# Patient Record
Sex: Female | Born: 1955 | Race: White | Hispanic: No | Marital: Married | Smoking: Never smoker
Health system: Southern US, Community
[De-identification: ages and names within clinical notes are randomized; demographics above are authoritative.]

---

## 1976-01-30 HISTORY — PX: PITUITARY SURGERY: SHX203

## 2012-02-14 ENCOUNTER — Encounter (HOSPITAL_COMMUNITY): Payer: Self-pay | Admitting: Radiology

## 2012-02-14 ENCOUNTER — Inpatient Hospital Stay (HOSPITAL_COMMUNITY)
Admission: AD | Admit: 2012-02-14 | Discharge: 2012-02-15 | DRG: 312 | Disposition: A | Discharge status: Home / Self Care | Payer: 59 | Source: Ambulatory Visit | Attending: Internal Medicine | Admitting: Internal Medicine

## 2012-02-14 ENCOUNTER — Observation Stay (HOSPITAL_COMMUNITY): Payer: 59

## 2012-02-14 ENCOUNTER — Inpatient Hospital Stay (HOSPITAL_COMMUNITY): Payer: 59

## 2012-02-14 DIAGNOSIS — R55 Syncope and collapse: Secondary | ICD-10-CM | POA: Diagnosis present

## 2012-02-14 DIAGNOSIS — R630 Anorexia: Secondary | ICD-10-CM | POA: Diagnosis present

## 2012-02-14 DIAGNOSIS — E876 Hypokalemia: Secondary | ICD-10-CM | POA: Diagnosis present

## 2012-02-14 DIAGNOSIS — E43 Unspecified severe protein-calorie malnutrition: Secondary | ICD-10-CM | POA: Diagnosis present

## 2012-02-14 DIAGNOSIS — Z885 Allergy status to narcotic agent status: Secondary | ICD-10-CM

## 2012-02-14 DIAGNOSIS — E860 Dehydration: Secondary | ICD-10-CM | POA: Diagnosis present

## 2012-02-14 DIAGNOSIS — E559 Vitamin D deficiency, unspecified: Secondary | ICD-10-CM | POA: Diagnosis present

## 2012-02-14 DIAGNOSIS — R634 Abnormal weight loss: Secondary | ICD-10-CM | POA: Diagnosis present

## 2012-02-14 DIAGNOSIS — Z681 Body mass index (BMI) 19 or less, adult: Secondary | ICD-10-CM

## 2012-02-14 DIAGNOSIS — M549 Dorsalgia, unspecified: Secondary | ICD-10-CM | POA: Diagnosis present

## 2012-02-14 DIAGNOSIS — Z880 Allergy status to penicillin: Secondary | ICD-10-CM

## 2012-02-14 DIAGNOSIS — I951 Orthostatic hypotension: Principal | ICD-10-CM | POA: Diagnosis present

## 2012-02-14 LAB — IRON AND TIBC
Saturation Ratios: 10 % — ABNORMAL LOW (ref 20–55)
UIBC: 390 ug/dL (ref 125–400)

## 2012-02-14 LAB — CBC
HCT: 32.1 % — ABNORMAL LOW (ref 36.0–46.0)
Hemoglobin: 10.1 g/dL — ABNORMAL LOW (ref 12.0–15.0)
RBC: 4.22 MIL/uL (ref 3.87–5.11)

## 2012-02-14 LAB — FERRITIN: Ferritin: 5 ng/mL — ABNORMAL LOW (ref 10–291)

## 2012-02-14 LAB — COMPREHENSIVE METABOLIC PANEL
ALT: 20 U/L (ref 0–35)
Alkaline Phosphatase: 53 U/L (ref 39–117)
CO2: 37 mEq/L — ABNORMAL HIGH (ref 19–32)
Chloride: 84 mEq/L — ABNORMAL LOW (ref 96–112)
GFR calc Af Amer: 69 mL/min — ABNORMAL LOW (ref >90)
GFR calc non Af Amer: 59 mL/min — ABNORMAL LOW (ref >90)
Glucose, Bld: 90 mg/dL (ref 70–99)
Potassium: 2.2 mEq/L — CL (ref 3.5–5.1)
Sodium: 134 mEq/L — ABNORMAL LOW (ref 135–145)
Total Bilirubin: 0.6 mg/dL (ref 0.3–1.2)

## 2012-02-14 LAB — CK TOTAL AND CKMB (NOT AT ARMC)
CK, MB: 4.5 ng/mL — ABNORMAL HIGH (ref 0.3–4.0)
Relative Index: 1.1 (ref 0.0–2.5)
Total CK: 462 U/L — ABNORMAL HIGH (ref 7–177)
Total CK: 395 U/L — ABNORMAL HIGH (ref 7–177)

## 2012-02-14 LAB — RETICULOCYTES
RBC.: 3.92 MIL/uL (ref 3.87–5.11)
Retic Ct Pct: 0.9 % (ref 0.4–3.1)

## 2012-02-14 LAB — CORTISOL: Cortisol, Plasma: 19.3 ug/dL

## 2012-02-14 LAB — VITAMIN B12: Vitamin B-12: 385 pg/mL (ref 211–911)

## 2012-02-14 MED ORDER — ALUM & MAG HYDROXIDE-SIMETH 200-200-20 MG/5ML PO SUSP: 30.0000 mL | Freq: Four times a day (QID) | ORAL | Status: DC | PRN

## 2012-02-14 MED ORDER — SODIUM CHLORIDE 0.9 % IV SOLN
INTRAVENOUS | Status: DC
  Administered 2012-02-14: 13:00:00 via INTRAVENOUS
  Administered 2012-02-15: 100 mL/h via INTRAVENOUS

## 2012-02-14 MED ORDER — ACETAMINOPHEN 325 MG PO TABS: 650.0000 mg | ORAL_TABLET | Freq: Four times a day (QID) | ORAL | Status: DC | PRN

## 2012-02-14 MED ORDER — ONDANSETRON HCL 4 MG/2ML IJ SOLN: 4.0000 mg | Freq: Four times a day (QID) | INTRAMUSCULAR | Status: DC | PRN

## 2012-02-14 MED ORDER — SODIUM CHLORIDE 0.9 % IJ SOLN
3.0000 mL | Freq: Two times a day (BID) | INTRAMUSCULAR | Status: DC
  Administered 2012-02-15: 3 mL via INTRAVENOUS

## 2012-02-14 MED ORDER — IOHEXOL 300 MG/ML  SOLN
25.0000 mL | INTRAMUSCULAR | Status: AC
  Administered 2012-02-14 (×2): 25 mL via ORAL

## 2012-02-14 MED ORDER — HYDROMORPHONE HCL PF 1 MG/ML IJ SOLN: 0.5000 mg | INTRAMUSCULAR | Status: DC | PRN

## 2012-02-14 MED ORDER — IOHEXOL 300 MG/ML  SOLN
80.0000 mL | Freq: Once | INTRAMUSCULAR | Status: AC | PRN
  Administered 2012-02-14: 80 mL via INTRAVENOUS

## 2012-02-14 MED ORDER — POTASSIUM CHLORIDE 10 MEQ/100ML IV SOLN
10.0000 meq | INTRAVENOUS | Status: AC
  Administered 2012-02-14 (×4): 10 meq via INTRAVENOUS
  Filled 2012-02-14 (×4): qty 100

## 2012-02-14 MED ORDER — POTASSIUM CHLORIDE CRYS ER 20 MEQ PO TBCR
40.0000 meq | EXTENDED_RELEASE_TABLET | Freq: Two times a day (BID) | ORAL | Status: DC
  Administered 2012-02-14 – 2012-02-15 (×2): 40 meq via ORAL
  Filled 2012-02-14 (×3): qty 2

## 2012-02-14 MED ORDER — ENSURE COMPLETE PO LIQD
237.0000 mL | Freq: Two times a day (BID) | ORAL | Status: DC
  Administered 2012-02-15: 237 mL via ORAL

## 2012-02-14 MED ORDER — ACETAMINOPHEN 650 MG RE SUPP: 650.0000 mg | Freq: Four times a day (QID) | RECTAL | Status: DC | PRN

## 2012-02-14 MED ORDER — ONDANSETRON HCL 4 MG PO TABS: 4.0000 mg | ORAL_TABLET | Freq: Four times a day (QID) | ORAL | Status: DC | PRN

## 2012-02-14 NOTE — Progress Notes (Signed)
K+ of 2.2 reported to Dr Isidoro Donning via text page.

## 2012-02-14 NOTE — Progress Notes (Signed)
*  PRELIMINARY RESULTS* Vascular Ultrasound Carotid Duplex (Doppler) has been completed.  Preliminary findings: Bilateral:  No evidence of hemodynamically significant internal carotid artery stenosis.   Vertebral artery flow is antegrade.      Farrel Demark, RDMS, RVT 02/14/2012, 3:11 PM

## 2012-02-14 NOTE — H&P (Signed)
History and Physical       Hospital Admission Note Date: 02/14/2012  Patient name: Bethany Frost Medical record number: [id number] Date of birth: 12/17/1955 Age: 57 y.o. 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 Fredirick Maudlin office today for dizziness and syncopal episode. History was obtained from the patient and her husband. Patient has not seen a physician in over 25 years, has never had a colonoscopy or EGD or any mammogram or any preventive medicine done. Per patient, she woke up early yesterday morning and while she was fixing the coffee she became dizzy and lightheaded, passed out. She began her consciousness and was cleaning the mess in the kitchen when she again passed out. Her husband was at her side and called 911, while in route to ED patient regained consciousness and did not want to be hospitalized. She endorses having off-and-on dizziness for a month, she also states that she has passed out a few times. Per patient she is a very active person and had been running and hiking uptill a month ago. Now it has been somewhat difficult for her to walk a block. She also states that she does not have appetite and she's not a 'big eater'. She denied any chest pain, nausea, vomiting, abdominal pain, diarrhea or any bleeding in the stools. Patient states that she does not have a scale in the house but thinks she has lost about 5-10 pounds in last month. Patient's husband separately told me that she deliberately does not eat and drinks only and she has been doing this for last 20 years, he felt that she has anorexia. He and the children has been very concerned about her.  He also stated that she runs for hours 2-3 times a day uptill a month ago.   Review of Systems:  Constitutional: Please see history of present illness  HEENT: Denies photophobia, eye pain, redness, hearing loss, ear pain, congestion, sore throat, rhinorrhea, sneezing, mouth sores, trouble swallowing, neck pain, neck stiffness and tinnitus.   Respiratory: Denies SOB, DOE, cough, chest tightness,  and wheezing.   Cardiovascular: Denies chest pain, palpitations and leg swelling.  Gastrointestinal: Denies nausea, vomiting, abdominal pain, diarrhea, constipation, blood in stool and abdominal distention.  patient endorses having loss of appetite Genitourinary: Denies dysuria, urgency, frequency, hematuria, flank pain and difficulty urinating.  Musculoskeletal: She endorses having low back pain due to fall yesterday  Skin: Denies pallor, rash and wound.  Neurological: Please see HPI Hematological: Denies adenopathy. Easy bruising, personal or family bleeding history  Psychiatric/Behavioral: Denies suicidal ideation, mood changes, confusion, nervousness, sleep disturbance and agitation  Past Medical History: No past medical history on file.  Past surgical history: History of pituitary surgery 1978 for pituitary tumor removal   Medications: Patient is not on any medications prior to admission Prior to Admission medications   Not on File    Allergies:   Allergies  Allergen Reactions  . Codeine Hives and Rash  . Penicillins Hives and Rash    Social History:  does not have a smoking history on file. She does not have any smokeless tobacco history on file. Her alcohol and drug histories not on file. She lives at home with her family, married,  3 children   Family History: No family history on file.  Physical Exam: Vitals at Dr. Darene Lamer office today weight 72.6 pounds, BMI 13.49 blood pressure lying 110/80 pulse 64 on standing 90/70 pulse 72 General: Alert, awake, oriented x3, in no  acute distress. Cachectic appearing HEENT: normocephalic, atraumatic, anicteric sclera, pale conjunctiva, PERLA, oropharynx clear Neck: supple, no masses or lymphadenopathy, no goiter, no bruits  Heart: Regular rate and rhythm, without murmurs, rubs or gallops. Lungs: Clear to auscultation bilaterally, no wheezing, rales or rhonchi. Abdomen: Soft, nontender, nondistended, NBS, no masses, ?Hepatomegaly, scaphoid  . Extremities: No clubbing, cyanosis or edema with positive pedal pulses. Neuro: Grossly intact, no focal neurological deficits, strength 5/5 upper and lower extremities bilaterally Psych: alert and oriented x 3, normal mood and affect Skin: no rashes or lesions, warm and dry   LABS on Admission:  None of the labs are available yet  Radiological Exams on Admission: No results found.  Assessment/Plan Principal Problem:  *Syncope and collapse: Likely due to severe protein-calorie malnutrition, severe dehydration, orthostatic hypotension - admit to tele, serial cardiac enzymes, IVfluid hydration, 2D ECHO, carotid Doppler for further workup  Active Problems:  Dehydration: Continue IV fluid hydration   Severe protein-calorie malnutrition/ weight anorexia, BMI of 13.4  - Although occult malignancy is a concern as patient has not seen a physician in 25 years, had no colonoscopy or mammogram or any preventive medicine, but she appears to have significant anorexia (as endorsed by husband and family). I have ordered CT abd and pelvis to rule out any occult malignancy  - ordered CBC, CMET, Albumin, TSH, vit B12, folate, cortisol level for work-up - baseline CXR and EKG - I explained to the patient that she will need to follow-up with her PCP for C-scope and mammogram out-patient.  Back Pain: - Lumbar Xray for further work-up  DVT prophylaxis: SCD's  CODE STATUS: Full Code  Further plan will depend as patient's clinical course evolves and further radiologic and laboratory data  become available.   Time Spent on Admission: 1 hour  Fuller Makin M.D. [facility] 02/14/2012, 1:46 PM Pager: [phone]  If 7PM-7AM, please contact night-coverage [email or web address] Password [email or web address]

## 2012-02-14 NOTE — Progress Notes (Signed)
  Echocardiogram 2D Echocardiogram has been performed.  Bethany Frost A 02/14/2012, 3:00 PM

## 2012-02-15 DIAGNOSIS — R630 Anorexia: Secondary | ICD-10-CM

## 2012-02-15 DIAGNOSIS — E876 Hypokalemia: Secondary | ICD-10-CM

## 2012-02-15 DIAGNOSIS — E860 Dehydration: Secondary | ICD-10-CM

## 2012-02-15 DIAGNOSIS — E43 Unspecified severe protein-calorie malnutrition: Secondary | ICD-10-CM

## 2012-02-15 DIAGNOSIS — I951 Orthostatic hypotension: Principal | ICD-10-CM

## 2012-02-15 DIAGNOSIS — R55 Syncope and collapse: Secondary | ICD-10-CM

## 2012-02-15 LAB — CK TOTAL AND CKMB (NOT AT ARMC)
CK, MB: 4.6 ng/mL — ABNORMAL HIGH (ref 0.3–4.0)
Total CK: 322 U/L — ABNORMAL HIGH (ref 7–177)

## 2012-02-15 LAB — CBC
MCHC: 31.6 g/dL (ref 30.0–36.0)
RDW: 18.2 % — ABNORMAL HIGH (ref 11.5–15.5)

## 2012-02-15 LAB — BASIC METABOLIC PANEL
BUN: 10 mg/dL (ref 6–23)
Calcium: 8.8 mg/dL (ref 8.4–10.5)
Creatinine, Ser: 0.93 mg/dL (ref 0.50–1.10)
GFR calc Af Amer: 78 mL/min — ABNORMAL LOW (ref >90)
GFR calc non Af Amer: 67 mL/min — ABNORMAL LOW (ref >90)

## 2012-02-15 LAB — LIPID PANEL
Cholesterol: 216 mg/dL — ABNORMAL HIGH (ref 0–200)
Triglycerides: 59 mg/dL (ref <150)

## 2012-02-15 LAB — VITAMIN D 25 HYDROXY (VIT D DEFICIENCY, FRACTURES): Vit D, 25-Hydroxy: 21 ng/mL — ABNORMAL LOW (ref 30–89)

## 2012-02-15 MED ORDER — ENSURE COMPLETE PO LIQD: 237.0000 mL | Freq: Two times a day (BID) | ORAL | Status: AC

## 2012-02-15 MED ORDER — VITAMIN D3 1.25 MG (50000 UT) PO CAPS: 1.0000 | ORAL_CAPSULE | ORAL | Status: DC

## 2012-02-15 MED ORDER — VITAMIN D3 1.25 MG (50000 UT) PO CAPS: 1.0000 | ORAL_CAPSULE | ORAL | Status: AC

## 2012-02-15 MED ORDER — THERA VITAL M PO TABS: 1.0000 | ORAL_TABLET | Freq: Every day | ORAL | Status: AC

## 2012-02-15 NOTE — Discharge Instructions (Signed)
Syncope Syncope means a person passes out (faints). The person usually wakes up in less than 5 minutes. It is important to seek medical care for syncope. HOME CARE  Have someone stay with you until you feel normal.  Do not drive, use machines, or play sports until your doctor says it is okay.  Keep all doctor visits as told.  Lie down when you feel like you might pass out. Take deep breaths. Wait until you feel normal before standing up.  Drink enough fluids to keep your pee (urine) clear or pale yellow.  If you take blood pressure or heart medicine, get up slowly. Take several minutes to sit and then stand. GET HELP RIGHT AWAY IF:   You have a severe headache.  You have pain in the chest, belly (abdomen), or back.  You are bleeding from the mouth or butt (rectum).  You have black or tarry poop (stool).  You have an irregular or very fast heartbeat.  You have pain with breathing.  You keep passing out, or you have shaking (seizures) when you pass out.  You pass out when sitting or lying down.  You feel confused.  You have trouble walking.  You have severe weakness.  You have vision problems. If you fainted, call your local emergency services (911 in [place] Do not drive yourself to the hospital. MAKE SURE YOU:   Understand these instructions.  Will watch your condition.  Will get help right away if you are not doing well or get worse. Document Released: 07/04/2007 Document Revised: 07/17/2011 Document Reviewed: 03/16/2011 [facility] Patient Information 2013 [place], [place]. Syncope Syncope means a person passes out (faints). The person usually wakes up in less than 5 minutes. It is important to seek medical care for syncope. HOME CARE  Have someone stay with you until you feel normal.  Do not drive, use machines, or play sports until your doctor says it is okay.  Keep all doctor visits as told.  Lie down when you feel like you might pass out. Take deep  breaths. Wait until you feel normal before standing up.  Drink enough fluids to keep your pee (urine) clear or pale yellow.  If you take blood pressure or heart medicine, get up slowly. Take several minutes to sit and then stand. GET HELP RIGHT AWAY IF:   You have a severe headache.  You have pain in the chest, belly (abdomen), or back.  You are bleeding from the mouth or butt (rectum).  You have black or tarry poop (stool).  You have an irregular or very fast heartbeat.  You have pain with breathing.  You keep passing out, or you have shaking (seizures) when you pass out.  You pass out when sitting or lying down.  You feel confused.  You have trouble walking.  You have severe weakness.  You have vision problems. If you fainted, call your local emergency services (911 in [place] Do not drive yourself to the hospital. MAKE SURE YOU:   Understand these instructions.  Will watch your condition.  Will get help right away if you are not doing well or get worse. Document Released: 07/04/2007 Document Revised: 07/17/2011 Document Reviewed: 03/16/2011 [facility] Patient Information 2013 [place], [place].

## 2012-02-15 NOTE — Discharge Summary (Signed)
Physician Discharge Summary  Bethany Frost YNW:[id number] DOB: 01-Jun-1955 DOA: 02/14/2012  PCP: Mickie Hillier, MD  Admit date: 02/14/2012 Discharge date: 02/15/2012  Time spent: >30 minutes  Recommendations for Outpatient Follow-up:  1. Further evaluation, assistance and help treating her anorexia condition 2. Recheck Vit D levels in about 12 weeks and change high dose vit D to maintenance dose depending levels. 3. BMET, Magnesium and Phosphorus level to be check during follow up visit to follow electrolytes.  Discharge Diagnoses:  Principal Problem:  *Syncope and collapse Active Problems:  Dehydration  Severe protein-calorie malnutrition  Anorexia  Orthostatic hypotension  Back pain Vit d deficiency  Discharge Condition: Stable and improved. Will arrange follow up with PCP and has been encourage to improve po intake and hydration.   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 Fredirick Maudlin office today for dizziness and syncopal episode. History was obtained from the patient and her husband. Patient has not seen a physician in over 25 years, has never had a colonoscopy or EGD or any mammogram or any preventive medicine done. Per patient, she woke up early yesterday morning and while she was fixing the coffee she became dizzy and lightheaded, passed out. She began her consciousness and was cleaning the mess in the kitchen when she again passed out. Her husband was at her side and called 911, while in route to ED patient regained consciousness and did not want to be hospitalized. She endorses having off-and-on dizziness for a month, she also states that she has passed out a few times. Per patient she is a very active person and had been running and hiking uptill a month ago.  Now it has been somewhat difficult for her to walk a block. She also states that she does not have appetite and she's not a 'big eater'. She denied any chest pain, nausea, vomiting, abdominal pain, diarrhea or any bleeding in the stools. Patient states that she does not have a scale in the house but thinks she has lost about 5-10 pounds in last month.  Patient's husband separately told me that she deliberately does not eat and drinks only and she has been doing this for last 20 years, he felt that she has anorexia. He and the children has been very concerned about her. He also stated that she runs for hours 2-3 times a day uptill a month ago.    Hospital Course:  1-syncope and collapse: due to orthostasis and decrease PO intake. Workup negative for acute abnormalities responsible for her passing out episodes. Patient felt much better and is asymptomatic after fluid resuscitation and electrolytes repletion.  2-Anorexia: BMI 13.4; patient advised to start MV daily, also to take ensure BID and to follow with PCP for further evaluation and treatment.  3-hypokalemia: repleted  4-Vit D deficiency: started on high dose vit D weekly  5-Severe protein calorie malnutrition: started on ensure and MV. Patient encourage to improve intake and hydration and to follow with PCP for further evaluation and treatment.  Procedures: 2-D echo (no wall motion abnormalities, EF 55-60%, no abnormal diastolic dysfunction) Carotid dopplers (Vertebral arteries with antegrade flow, no significant stenosis) See below for xray/images reports.  Consultations:  none  Discharge Exam: Filed Vitals:   02/14/12 1619 02/14/12 2023 02/15/12  1610 02/15/12 0458  BP: 105/68 82/46 79/39  85/41  Pulse: 77 57 50   Temp:  98 F (36.7 C) 98.2 F (36.8 C)   TempSrc:  Oral Oral   Resp:  18 16   Height:      Weight:      SpO2:  98% 100%     General: patient w/o further episodes of lightheadedness or syncope after fluid  resuscitation given. Very thin on exam. BMI 13.4 Cardiovascular: S1 and S2, no rubs or gallops Respiratory: CTA bilaterally Abdomen: soft, NT, ND, positive BS Neuro: non focal deficit.  Discharge Instructions  Discharge Orders    Future Orders Please Complete By Expires   Discharge instructions      Comments:   -keep yourself well hydrated and improve nutrition -take medications as prescribed -Please arrange follow up visit with PCP in 2 weeks       Medication List     As of 02/15/2012 10:44 AM    TAKE these medications         feeding supplement Liqd   Take 237 mLs by mouth 2 (two) times daily between meals.      multivitamin tablet   Take 1 tablet by mouth daily.      Vitamin D3 50000 UNITS Caps   Take 1 capsule by mouth once a week.           Follow-up Information    Follow up with Mickie Hillier, MD. Schedule an appointment as soon as possible for a visit in 2 weeks.   Contact information:   [address] [place] [address] [phone]           The results of significant diagnostics from this hospitalization (including imaging, microbiology, ancillary and laboratory) are listed below for reference.    Significant Diagnostic Studies: Dg Chest 2 View  02/14/2012  *RADIOLOGY REPORT*  Clinical Data: Low back pain.  CHEST - 2 VIEW  Comparison: None.  Findings: The heart size is normal.  Emphysematous changes are noted.  The visualized soft tissues and bony thorax are unremarkable.  IMPRESSION:  1.  Emphysema. 2.  No acute cardiopulmonary disease.   Original Report Authenticated By: Marin Roberts, M.D.    Dg Lumbar Spine 2-3 Views  02/14/2012  *RADIOLOGY REPORT*  Clinical Data: Fall.  Low back pain.  Pain in the tail bone.  LUMBAR SPINE - 2-3 VIEW  Comparison: None available.  Findings: Five non-rib bearing lumbar type vertebral bodies are present.  Slight leftward curvature is present in the lumbar spine. Vertebral body heights and alignment are  maintained.  The visualized sacrum and coccyx are intact.  IMPRESSION:  1.  Normal two-view radiographs of the lumbar spine.   Original Report Authenticated By: Marin Roberts, M.D.    Ct Abdomen Pelvis W Contrast  02/14/2012  *RADIOLOGY REPORT*  Clinical Data: Weight loss.  Question occult malignancy.  CT ABDOMEN AND PELVIS WITH CONTRAST  Technique:  Multidetector CT imaging of the abdomen and pelvis was performed following the standard protocol during bolus administration of intravenous contrast.  Contrast: 80mL OMNIPAQUE IOHEXOL 300 MG/ML  SOLN  Comparison: None.  Findings: Lung bases are clear.  No effusions.  Heart is normal size.  There is a small hiatal hernia.  Liver, gallbladder, stomach, spleen, pancreas, adrenals and kidneys are normal.  No evidence of hepatomegaly.  No evidence of bowel obstruction.  There appears to be a thickened small bowel loop in the left abdomen.  Contrast is passed  beyond this level and this is difficult to evaluate with the lack of intraluminal contrast and intraperitoneal fat.  The remainder of the small bowel and colon are unremarkable.  Uterus, adnexa urinary bladder grossly unremarkable.  No free fluid, free air or adenopathy.  Aorta is normal caliber.  No acute bony abnormality.  IMPRESSION: Questionable thickened small bowel loop in the left abdomen, difficult to evaluate with the lack of intraluminal contrast in this segment and the lack of intraperitoneal fat.   Original Report Authenticated By: Charlett Nose, M.D.    Labs: Basic Metabolic Panel:  Lab 02/15/12 5784 02/14/12 1633 02/14/12 1314  NA 136 -- 134*  K 3.7 -- 2.2*  CL 96 -- 84*  CO2 32 -- 37*  GLUCOSE 81 -- 90  BUN 10 -- 13  CREATININE 0.93 -- 1.03  CALCIUM 8.8 -- 9.4  MG -- 1.9 --  PHOS -- -- --   Liver Function Tests:  Lab 02/14/12 1314  AST 37  ALT 20  ALKPHOS 53  BILITOT 0.6  PROT 7.1  ALBUMIN 3.7   CBC:  Lab 02/15/12 0218 02/14/12 1314  WBC 4.9 8.3  NEUTROABS -- --  HGB  8.9* 10.1*  HCT 28.2* 32.1*  MCV 76.6* 76.1*  PLT 395 466*   Cardiac Enzymes:  Lab 02/15/12 0218 02/14/12 1948 02/14/12 1318  CKTOTAL 322* 395* 462*  CKMB 4.6* 4.5* 5.2*  CKMBINDEX -- -- --  TROPONINI <0.30 <0.30 <0.30    Signed:  Earsie Humm  [facility] 02/15/2012, 10:44 AM

## 2012-02-16 LAB — URINE CULTURE: Colony Count: 2000

## 2012-02-19 LAB — VITAMIN D 1,25 DIHYDROXY: Vitamin D 1, 25 (OH)2 Total: 57 pg/mL (ref 18–72)

## 2014-03-20 IMAGING — CT CT ABD-PELV W/ CM
2 of 5 series · 17 of 46 positions shown, 19 images · IV contrast (water/omni  & 80ml omni 300)
Comparison: None.

CLINICAL DATA: Weight loss.  Question occult malignancy.

CT ABDOMEN AND PELVIS WITH CONTRAST
TECHNIQUE: Multidetector CT imaging of the abdomen and pelvis was
performed following the standard protocol during bolus
administration of intravenous contrast.
Contrast: 80mL OMNIPAQUE IOHEXOL 300 MG/ML  SOLN

[Series 2: routine abdomen · axial · 0.65mm/px · z∈[-502,-107]mm · 14 of 89 slices shown, 16 images]
[im 5/89  soft-tissue]
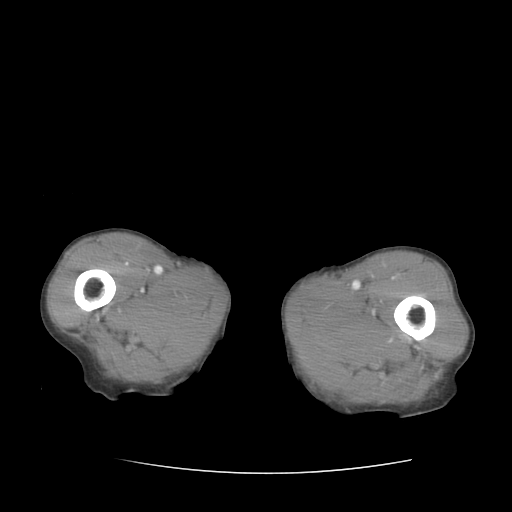
[im 5/89  bone]
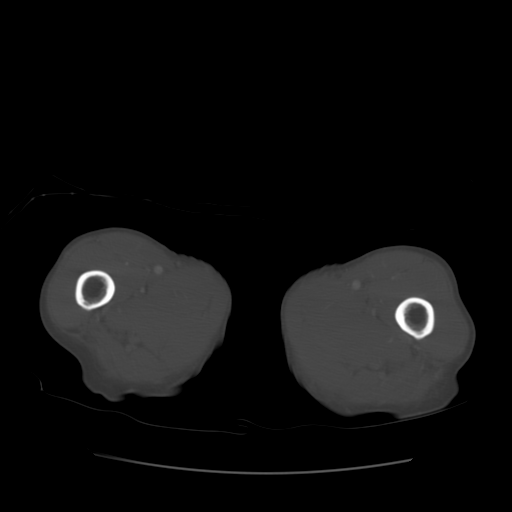
[im 14/89  soft-tissue]
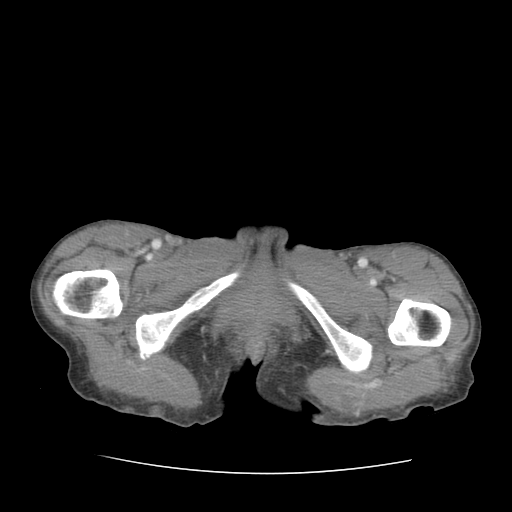
[im 18/89  soft-tissue]
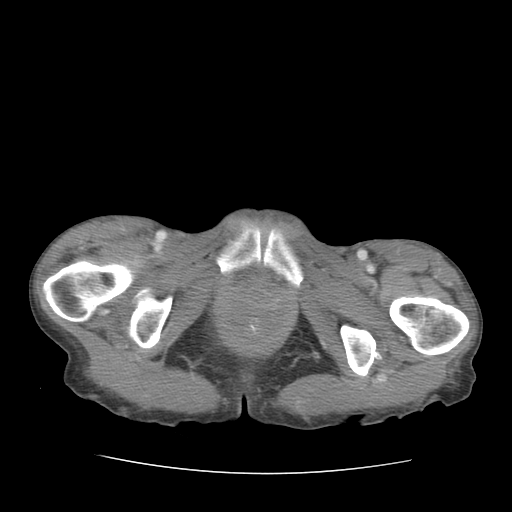
[im 23/89  soft-tissue]
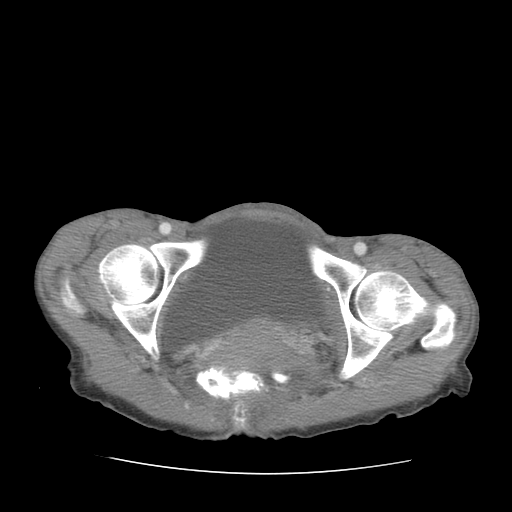
[im 31/89  soft-tissue]
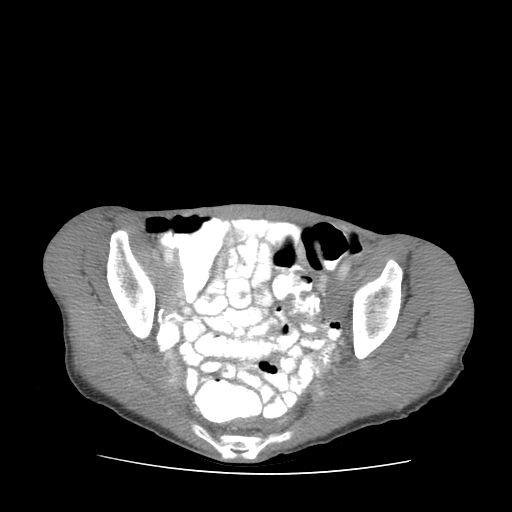
[im 36/89  soft-tissue]
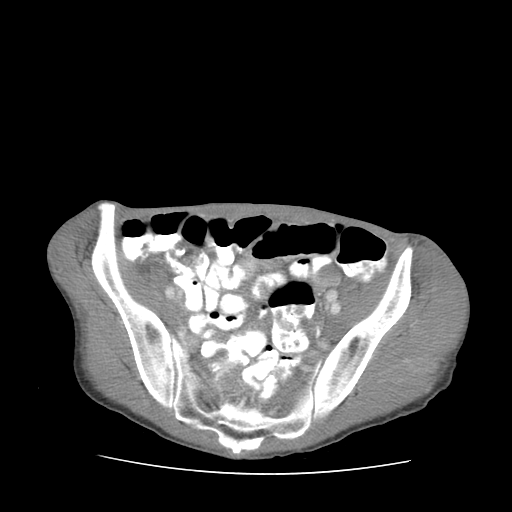
[im 40/89  soft-tissue]
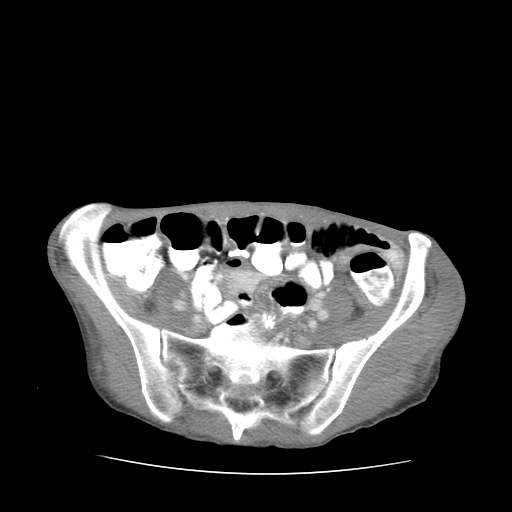
[im 49/89  soft-tissue]
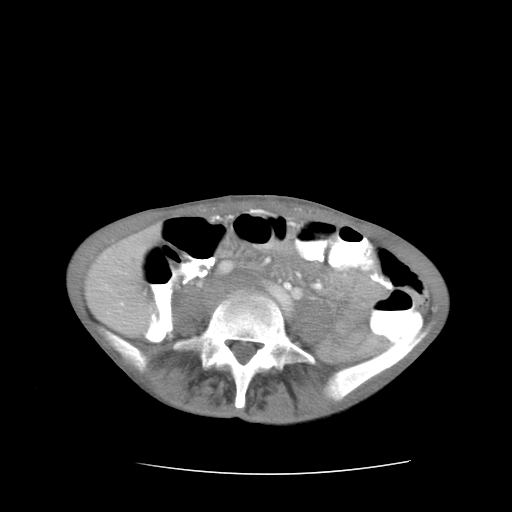
[im 53/89  soft-tissue]
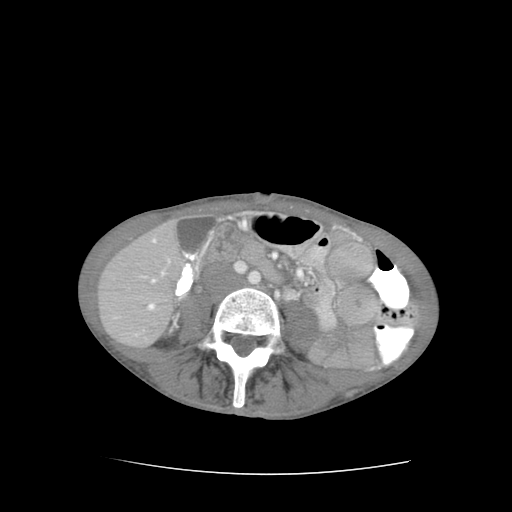
[im 53/89  bone]
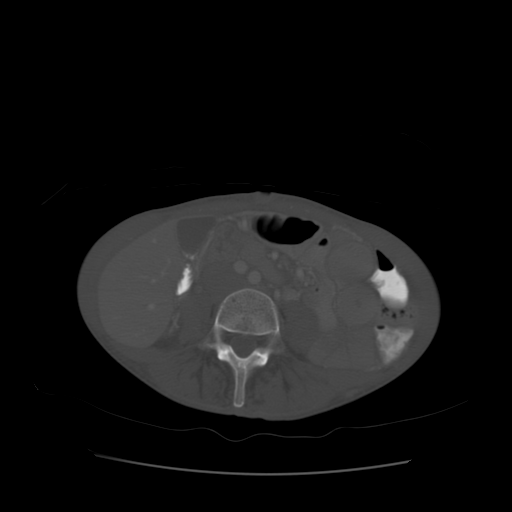
[im 58/89  soft-tissue]
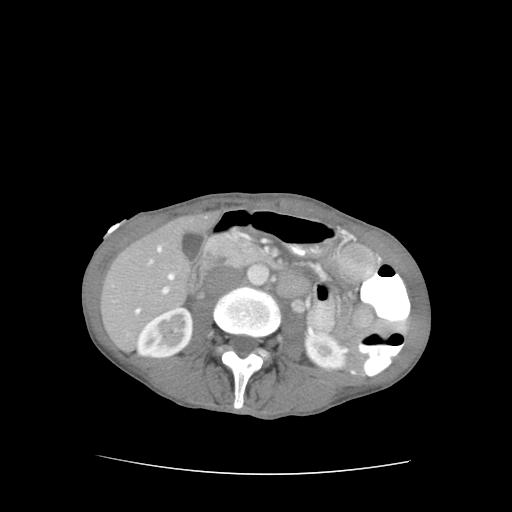
[im 67/89  soft-tissue]
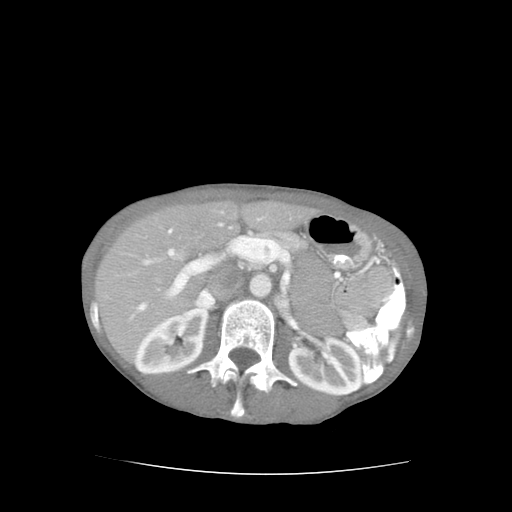
[im 71/89  soft-tissue]
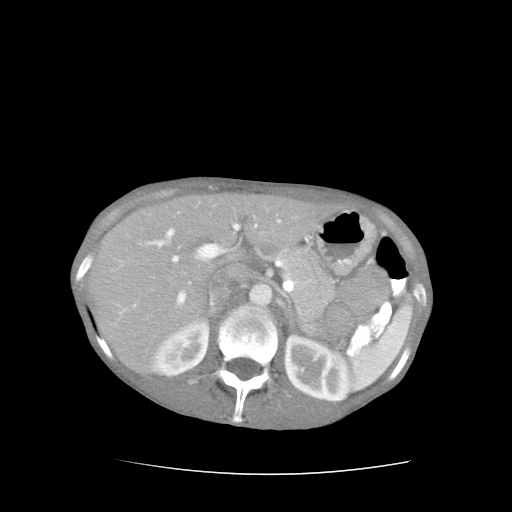
[im 75/89  soft-tissue]
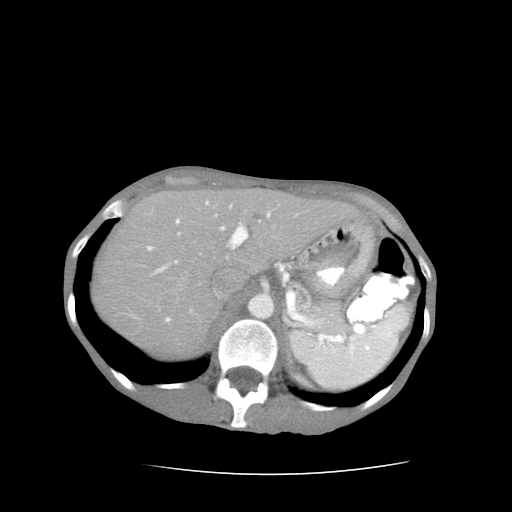
[im 84/89  soft-tissue]
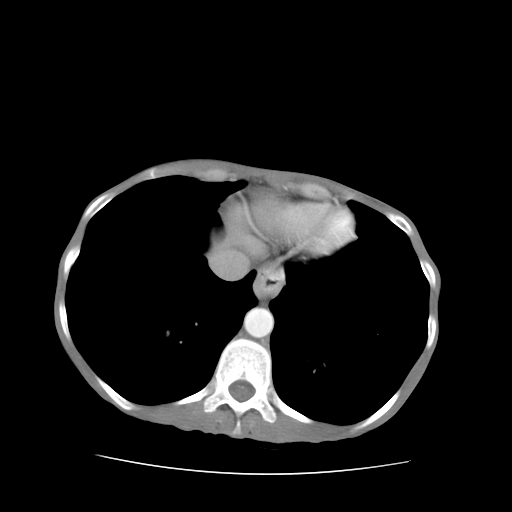

[Series 401: cor · coronal · 0.88mm/px · 3 of 69 slices shown]
[im 31/69  soft-tissue]
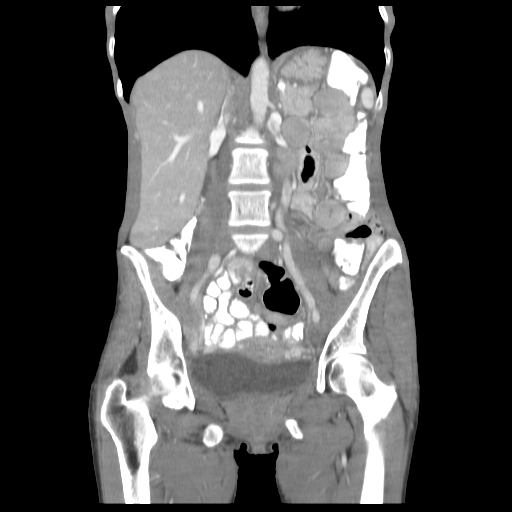
[im 38/69  soft-tissue]
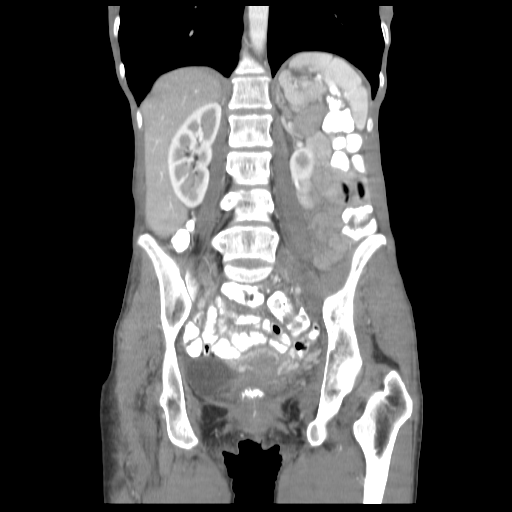
[im 46/69  soft-tissue]
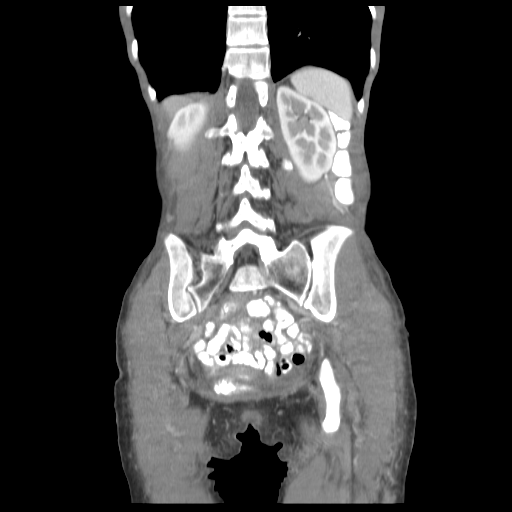

[17 of 46 positions shown; findings below may reference images not displayed]

FINDINGS: Lung bases are clear.  No effusions.  Heart is normal
size.

There is a small hiatal hernia.  Liver, gallbladder, stomach,
spleen, pancreas, adrenals and kidneys are normal.  No evidence of
hepatomegaly.  No evidence of bowel obstruction.  There appears to
be a thickened small bowel loop in the left abdomen.  Contrast is
passed beyond this level and this is difficult to evaluate with the
lack of intraluminal contrast and intraperitoneal fat.  The
remainder of the small bowel and colon are unremarkable.  Uterus,
adnexa urinary bladder grossly unremarkable.  No free fluid, free
air or adenopathy.  Aorta is normal caliber.

No acute bony abnormality.
IMPRESSION: Questionable thickened small bowel loop in the left abdomen,
difficult to evaluate with the lack of intraluminal contrast in
this segment and the lack of intraperitoneal fat.

## 2021-10-29 DEATH — deceased
